# Patient Record
Sex: Male | Born: 1955 | Race: White | Hispanic: No | Marital: Married | Smoking: Former smoker
Health system: Southern US, Community
[De-identification: ages and names within clinical notes are randomized; demographics above are authoritative.]

## PROBLEM LIST (undated history)

## (undated) DIAGNOSIS — C801 Malignant (primary) neoplasm, unspecified: Secondary | ICD-10-CM

## (undated) DIAGNOSIS — G8929 Other chronic pain: Secondary | ICD-10-CM

---

## 2012-04-21 HISTORY — PX: THYROIDECTOMY: SHX17

## 2012-11-17 ENCOUNTER — Other Ambulatory Visit (HOSPITAL_COMMUNITY): Payer: Self-pay | Admitting: Endocrinology

## 2012-11-17 DIAGNOSIS — C73 Malignant neoplasm of thyroid gland: Secondary | ICD-10-CM

## 2012-11-24 ENCOUNTER — Encounter (HOSPITAL_COMMUNITY)
Admission: RE | Admit: 2012-11-24 | Discharge: 2012-11-24 | Disposition: A | Discharge status: Home / Self Care | Payer: BC Managed Care – PPO | Source: Ambulatory Visit | Attending: Endocrinology | Admitting: Endocrinology

## 2012-11-24 DIAGNOSIS — C73 Malignant neoplasm of thyroid gland: Secondary | ICD-10-CM | POA: Insufficient documentation

## 2012-11-24 MED ORDER — THYROTROPIN ALFA 1.1 MG IM SOLR
0.9000 mg | INTRAMUSCULAR | Status: DC
  Filled 2012-11-24: qty 0.9

## 2012-11-25 ENCOUNTER — Encounter (HOSPITAL_COMMUNITY)
Admission: RE | Admit: 2012-11-25 | Discharge: 2012-11-25 | Disposition: A | Discharge status: Home / Self Care | Payer: BC Managed Care – PPO | Source: Ambulatory Visit | Attending: Endocrinology | Admitting: Endocrinology

## 2012-11-25 MED ORDER — THYROTROPIN ALFA 1.1 MG IM SOLR
0.9000 mg | INTRAMUSCULAR | Status: AC
  Administered 2012-11-25: 0.9 mg via INTRAMUSCULAR
  Filled 2012-11-25: qty 0.9

## 2012-11-26 ENCOUNTER — Encounter (HOSPITAL_COMMUNITY)
Admission: RE | Admit: 2012-11-26 | Discharge: 2012-11-26 | Disposition: A | Discharge status: Home / Self Care | Payer: BC Managed Care – PPO | Source: Ambulatory Visit | Attending: Endocrinology | Admitting: Endocrinology

## 2012-11-26 MED ORDER — SODIUM IODIDE I 131 CAPSULE
71.8000 | Freq: Once | INTRAVENOUS | Status: AC | PRN
  Administered 2012-11-26: 71.8 via ORAL

## 2012-11-30 MED FILL — Thyrotropin Alfa For Inj 1.1 MG: INTRAMUSCULAR | Qty: 0.9 | Status: AC

## 2012-12-06 ENCOUNTER — Encounter (HOSPITAL_COMMUNITY)
Admission: RE | Admit: 2012-12-06 | Discharge: 2012-12-06 | Disposition: A | Discharge status: Home / Self Care | Payer: BC Managed Care – PPO | Source: Ambulatory Visit | Attending: Endocrinology | Admitting: Endocrinology

## 2012-12-06 DIAGNOSIS — C73 Malignant neoplasm of thyroid gland: Secondary | ICD-10-CM | POA: Insufficient documentation

## 2013-03-21 ENCOUNTER — Emergency Department (HOSPITAL_COMMUNITY): Payer: Worker's Compensation

## 2013-03-21 ENCOUNTER — Emergency Department (HOSPITAL_COMMUNITY)
Admission: EM | Admit: 2013-03-21 | Discharge: 2013-03-21 | Disposition: A | Discharge status: Home / Self Care | Payer: Worker's Compensation | Attending: Emergency Medicine | Admitting: Emergency Medicine

## 2013-03-21 ENCOUNTER — Encounter (HOSPITAL_COMMUNITY): Payer: Self-pay | Admitting: Emergency Medicine

## 2013-03-21 DIAGNOSIS — IMO0002 Reserved for concepts with insufficient information to code with codable children: Secondary | ICD-10-CM | POA: Insufficient documentation

## 2013-03-21 DIAGNOSIS — Z79899 Other long term (current) drug therapy: Secondary | ICD-10-CM | POA: Insufficient documentation

## 2013-03-21 DIAGNOSIS — M549 Dorsalgia, unspecified: Secondary | ICD-10-CM

## 2013-03-21 DIAGNOSIS — Y9389 Activity, other specified: Secondary | ICD-10-CM | POA: Insufficient documentation

## 2013-03-21 DIAGNOSIS — Z791 Long term (current) use of non-steroidal anti-inflammatories (NSAID): Secondary | ICD-10-CM | POA: Insufficient documentation

## 2013-03-21 DIAGNOSIS — M542 Cervicalgia: Secondary | ICD-10-CM

## 2013-03-21 DIAGNOSIS — Z87891 Personal history of nicotine dependence: Secondary | ICD-10-CM | POA: Insufficient documentation

## 2013-03-21 DIAGNOSIS — S0993XA Unspecified injury of face, initial encounter: Secondary | ICD-10-CM | POA: Insufficient documentation

## 2013-03-21 DIAGNOSIS — Z85850 Personal history of malignant neoplasm of thyroid: Secondary | ICD-10-CM | POA: Insufficient documentation

## 2013-03-21 DIAGNOSIS — Y92410 Unspecified street and highway as the place of occurrence of the external cause: Secondary | ICD-10-CM | POA: Insufficient documentation

## 2013-03-21 HISTORY — DX: Malignant (primary) neoplasm, unspecified: C80.1

## 2013-03-21 MED ORDER — MELOXICAM 7.5 MG PO TABS: 15.0000 mg | ORAL_TABLET | Freq: Every day | ORAL | Status: DC

## 2013-03-21 MED ORDER — TRAMADOL HCL 50 MG PO TABS: 50.0000 mg | ORAL_TABLET | Freq: Four times a day (QID) | ORAL | Status: DC | PRN

## 2013-03-21 MED ORDER — OXYCODONE-ACETAMINOPHEN 5-325 MG PO TABS
2.0000 | ORAL_TABLET | Freq: Once | ORAL | Status: AC
  Administered 2013-03-21: 2 via ORAL
  Filled 2013-03-21: qty 2

## 2013-03-21 MED ORDER — METHOCARBAMOL 500 MG PO TABS: 500.0000 mg | ORAL_TABLET | Freq: Two times a day (BID) | ORAL | Status: DC
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
## 2013-03-23 NOTE — ED Provider Notes (Signed)
Medical screening examination/treatment/procedure(s) were performed by non-physician practitioner and as supervising physician I was immediately available for consultation/collaboration.  EKG Interpretation   None         Candyce Churn, MD 03/23/13 1340

## 2014-08-13 IMAGING — NM NM RAI THYROID CANCER W/ THYROGEN
1 series · 1 of 1 positions shown · non-contrast
Comparison: None.

CLINICAL DATA: Thyroid cancer status post thyroidectomy 09/24/2012.

RADIOACTIVE IODINE THERAPY FOR THYROID CANCER:

[Series 1: st static · 1 of 1 slices shown]
[im 1/1]
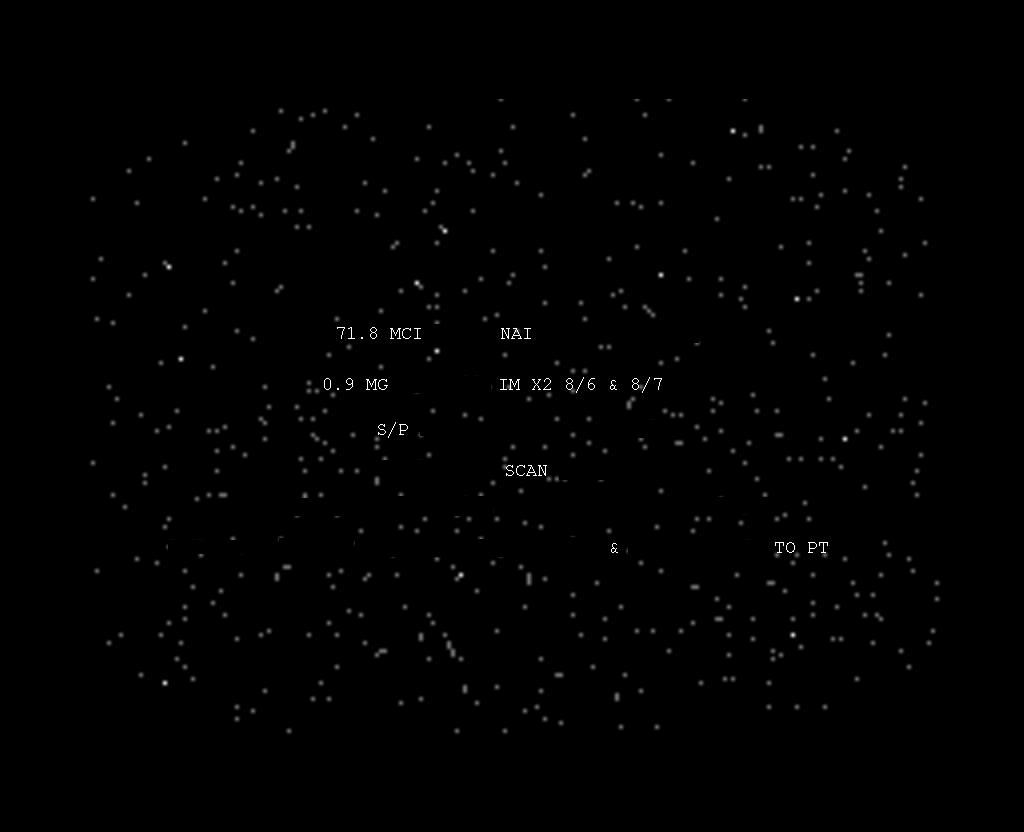

[1 of 1 positions shown; findings below may reference images not displayed]

Procedure: The patient was given intramuscular thyrogen 0.9 mg on
[DATE] and 11/25/2012.  Radioactive iodine was prescribed by Dr.
Menzel.  The risks and benefits of radioactive iodine therapy were
discussed with the patient in detail today by Dr. Ice.
Alternative therapies were also mentioned.  Radiation safety was
discussed with the patient, including how to protect the general
public from exposure.  There were no barriers to communication.
Written consent was obtained.  The patient then received a capsule
containing the radiopharmaceutical.

The patient will follow-up with the referring physician.

Radiopharmaceutical:  71.8 mCi V-KFK sodium iodide.
IMPRESSION: Per oral administration of V-KFK sodium iodide for the treatment of
thyroid cancer.
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
## 2015-08-09 DIAGNOSIS — C73 Malignant neoplasm of thyroid gland: Secondary | ICD-10-CM | POA: Insufficient documentation

## 2015-08-09 DIAGNOSIS — E890 Postprocedural hypothyroidism: Secondary | ICD-10-CM | POA: Insufficient documentation

## 2016-02-07 DIAGNOSIS — E785 Hyperlipidemia, unspecified: Secondary | ICD-10-CM | POA: Insufficient documentation

## 2016-08-07 DIAGNOSIS — J0101 Acute recurrent maxillary sinusitis: Secondary | ICD-10-CM | POA: Insufficient documentation

## 2017-02-25 DIAGNOSIS — D171 Benign lipomatous neoplasm of skin and subcutaneous tissue of trunk: Secondary | ICD-10-CM | POA: Insufficient documentation

## 2017-03-05 DIAGNOSIS — Z09 Encounter for follow-up examination after completed treatment for conditions other than malignant neoplasm: Secondary | ICD-10-CM | POA: Insufficient documentation

## 2017-04-23 DIAGNOSIS — Z9109 Other allergy status, other than to drugs and biological substances: Secondary | ICD-10-CM | POA: Insufficient documentation

## 2017-04-23 DIAGNOSIS — H7403 Tympanosclerosis, bilateral: Secondary | ICD-10-CM | POA: Insufficient documentation

## 2017-04-23 DIAGNOSIS — H93291 Other abnormal auditory perceptions, right ear: Secondary | ICD-10-CM | POA: Insufficient documentation

## 2021-05-27 DIAGNOSIS — L02612 Cutaneous abscess of left foot: Secondary | ICD-10-CM | POA: Diagnosis not present

## 2021-05-27 DIAGNOSIS — S161XXA Strain of muscle, fascia and tendon at neck level, initial encounter: Secondary | ICD-10-CM | POA: Diagnosis not present

## 2021-08-27 DIAGNOSIS — J329 Chronic sinusitis, unspecified: Secondary | ICD-10-CM | POA: Diagnosis not present

## 2021-08-27 DIAGNOSIS — J40 Bronchitis, not specified as acute or chronic: Secondary | ICD-10-CM | POA: Diagnosis not present

## 2021-08-27 DIAGNOSIS — Z6828 Body mass index (BMI) 28.0-28.9, adult: Secondary | ICD-10-CM | POA: Diagnosis not present

## 2021-09-30 DIAGNOSIS — Z6828 Body mass index (BMI) 28.0-28.9, adult: Secondary | ICD-10-CM | POA: Diagnosis not present

## 2021-09-30 DIAGNOSIS — M25561 Pain in right knee: Secondary | ICD-10-CM | POA: Diagnosis not present

## 2021-09-30 DIAGNOSIS — M533 Sacrococcygeal disorders, not elsewhere classified: Secondary | ICD-10-CM | POA: Diagnosis not present

## 2021-10-29 DIAGNOSIS — S91119A Laceration without foreign body of unspecified toe without damage to nail, initial encounter: Secondary | ICD-10-CM | POA: Diagnosis not present

## 2021-10-29 DIAGNOSIS — L84 Corns and callosities: Secondary | ICD-10-CM | POA: Diagnosis not present

## 2021-10-29 DIAGNOSIS — Z6827 Body mass index (BMI) 27.0-27.9, adult: Secondary | ICD-10-CM | POA: Diagnosis not present

## 2021-11-04 ENCOUNTER — Ambulatory Visit: Payer: Medicare PPO | Admitting: Podiatry

## 2021-11-04 ENCOUNTER — Encounter: Payer: Self-pay | Admitting: Podiatry

## 2021-11-04 DIAGNOSIS — L97521 Non-pressure chronic ulcer of other part of left foot limited to breakdown of skin: Secondary | ICD-10-CM | POA: Diagnosis not present

## 2021-11-04 NOTE — Progress Notes (Signed)
  Subjective:  Patient ID: Stephen Hose., male    DOB: [birth year],   MRN: [id number]  Chief Complaint  Patient presents with   Callouses    66 y.o. male presents for concern of callus on left great toe. Relates the callus has been there for 17+ years but recently in the last few months it blistered up and had blood come out. It healed and then did the same thing. Here for evaluation. Has been keeping liquid bandaid on it . He is not diabetic. Denies any other pedal complaints. Denies n/v/f/c.   Past Medical History:  Diagnosis Date   Cancer [facility])    Thyroid    Objective:  Physical Exam: Vascular: DP/PT pulses 2/4 bilateral. CFT <3 seconds. Normal hair growth on digits. No edema.  Skin. No lacerations or abrasions bilateral feet. Left hallux medially ulcer upon debridement of hyperkeratotic tissue. Granular base measures about 0.1 cm x 0.2 cmx 0.1 cm. No erythema edema or purulence noted.  Musculoskeletal: MMT 5/5 bilateral lower extremities in DF, PF, Inversion and Eversion. Deceased ROM in DF of ankle joint.  Neurological: Sensation intact to light touch.   Assessment:   1. Skin ulcer of great toe, limited to breakdown of skin, left [facility])      Plan:  Patient was evaluated and treated and all questions answered. Ulcer left hallux limited to breakdown of skin  -Debridement as below. -Dressed with neosporin, DSD. -Off-loading with toe cap -No abx indicated.  -Discussed glucose control and proper protein-rich diet.  -Discussed if any worsening redness, pain, fever or chills to call or may need to report to the emergency room. Patient expressed understanding.   Procedure: Excisional Debridement of Wound Rationale: Removal of non-viable soft tissue from the wound to promote healing.  Anesthesia: none Pre-Debridement Wound Measurements: Overlying callus  Post-Debridement Wound Measurements: 0.1 cm x 0.2 cm x 0.1 cm  Type of Debridement: Sharp Excisional Tissue Removed:  Non-viable soft tissue Depth of Debridement: subcutaneous tissue. Technique: Sharp excisional debridement to bleeding, viable wound base.  Dressing: Dry, sterile, compression dressing. Disposition: Patient tolerated procedure well. Patient to return in 3 week for follow-up.  Return in about 2 weeks (around 11/18/2021) for wound check.   Lorenda Peck, DPM

## 2021-11-25 ENCOUNTER — Ambulatory Visit: Payer: Medicare PPO | Admitting: Podiatry

## 2022-03-24 DIAGNOSIS — D3132 Benign neoplasm of left choroid: Secondary | ICD-10-CM | POA: Diagnosis not present

## 2022-03-24 DIAGNOSIS — H18413 Arcus senilis, bilateral: Secondary | ICD-10-CM | POA: Diagnosis not present

## 2022-03-24 DIAGNOSIS — H25813 Combined forms of age-related cataract, bilateral: Secondary | ICD-10-CM | POA: Diagnosis not present

## 2022-03-24 DIAGNOSIS — H532 Diplopia: Secondary | ICD-10-CM | POA: Diagnosis not present

## 2022-03-24 DIAGNOSIS — H35373 Puckering of macula, bilateral: Secondary | ICD-10-CM | POA: Diagnosis not present

## 2022-03-24 DIAGNOSIS — H43392 Other vitreous opacities, left eye: Secondary | ICD-10-CM | POA: Diagnosis not present

## 2022-09-08 DIAGNOSIS — Z79899 Other long term (current) drug therapy: Secondary | ICD-10-CM | POA: Diagnosis not present

## 2022-09-08 DIAGNOSIS — Z0000 Encounter for general adult medical examination without abnormal findings: Secondary | ICD-10-CM | POA: Diagnosis not present

## 2022-09-08 DIAGNOSIS — E039 Hypothyroidism, unspecified: Secondary | ICD-10-CM | POA: Diagnosis not present

## 2022-09-08 DIAGNOSIS — Z1331 Encounter for screening for depression: Secondary | ICD-10-CM | POA: Diagnosis not present

## 2022-09-08 DIAGNOSIS — Z1339 Encounter for screening examination for other mental health and behavioral disorders: Secondary | ICD-10-CM | POA: Diagnosis not present

## 2022-09-08 DIAGNOSIS — Z6828 Body mass index (BMI) 28.0-28.9, adult: Secondary | ICD-10-CM | POA: Diagnosis not present

## 2022-09-08 DIAGNOSIS — Z125 Encounter for screening for malignant neoplasm of prostate: Secondary | ICD-10-CM | POA: Diagnosis not present

## 2022-09-08 DIAGNOSIS — G629 Polyneuropathy, unspecified: Secondary | ICD-10-CM | POA: Diagnosis not present

## 2022-09-08 DIAGNOSIS — M2550 Pain in unspecified joint: Secondary | ICD-10-CM | POA: Diagnosis not present

## 2023-06-18 DIAGNOSIS — Z6829 Body mass index (BMI) 29.0-29.9, adult: Secondary | ICD-10-CM | POA: Diagnosis not present

## 2023-06-18 DIAGNOSIS — E039 Hypothyroidism, unspecified: Secondary | ICD-10-CM | POA: Diagnosis not present

## 2023-06-18 DIAGNOSIS — E7800 Pure hypercholesterolemia, unspecified: Secondary | ICD-10-CM | POA: Diagnosis not present

## 2023-06-18 DIAGNOSIS — G629 Polyneuropathy, unspecified: Secondary | ICD-10-CM | POA: Diagnosis not present

## 2023-06-18 DIAGNOSIS — K589 Irritable bowel syndrome without diarrhea: Secondary | ICD-10-CM | POA: Diagnosis not present

## 2023-08-07 DIAGNOSIS — R6883 Chills (without fever): Secondary | ICD-10-CM | POA: Diagnosis not present

## 2023-08-07 DIAGNOSIS — R1084 Generalized abdominal pain: Secondary | ICD-10-CM | POA: Diagnosis not present

## 2023-08-07 DIAGNOSIS — K529 Noninfective gastroenteritis and colitis, unspecified: Secondary | ICD-10-CM | POA: Diagnosis not present

## 2023-08-11 DIAGNOSIS — K5792 Diverticulitis of intestine, part unspecified, without perforation or abscess without bleeding: Secondary | ICD-10-CM | POA: Diagnosis not present

## 2023-11-16 ENCOUNTER — Ambulatory Visit (INDEPENDENT_AMBULATORY_CARE_PROVIDER_SITE_OTHER)
Admit: 2023-11-16 | Discharge: 2023-11-16 | Disposition: A | Discharge status: Home / Self Care | Attending: Internal Medicine | Admitting: Internal Medicine

## 2023-11-16 ENCOUNTER — Encounter (HOSPITAL_BASED_OUTPATIENT_CLINIC_OR_DEPARTMENT_OTHER): Payer: Self-pay

## 2023-11-16 ENCOUNTER — Other Ambulatory Visit (HOSPITAL_BASED_OUTPATIENT_CLINIC_OR_DEPARTMENT_OTHER): Payer: Self-pay

## 2023-11-16 ENCOUNTER — Ambulatory Visit (HOSPITAL_BASED_OUTPATIENT_CLINIC_OR_DEPARTMENT_OTHER)
Admission: EM | Admit: 2023-11-16 | Discharge: 2023-11-16 | Disposition: A | Discharge status: Home / Self Care | Attending: Internal Medicine | Admitting: Internal Medicine

## 2023-11-16 DIAGNOSIS — J208 Acute bronchitis due to other specified organisms: Secondary | ICD-10-CM | POA: Diagnosis not present

## 2023-11-16 DIAGNOSIS — B9689 Other specified bacterial agents as the cause of diseases classified elsewhere: Secondary | ICD-10-CM

## 2023-11-16 DIAGNOSIS — R059 Cough, unspecified: Secondary | ICD-10-CM | POA: Diagnosis not present

## 2023-11-16 DIAGNOSIS — R0602 Shortness of breath: Secondary | ICD-10-CM

## 2023-11-16 DIAGNOSIS — R918 Other nonspecific abnormal finding of lung field: Secondary | ICD-10-CM | POA: Diagnosis not present

## 2023-11-16 DIAGNOSIS — J984 Other disorders of lung: Secondary | ICD-10-CM | POA: Diagnosis not present

## 2023-11-16 HISTORY — DX: Other chronic pain: G89.29

## 2023-11-16 MED ORDER — AZITHROMYCIN 250 MG PO TABS
ORAL_TABLET | ORAL | 0 refills | Status: AC
  Filled 2023-11-16: qty 6, 5d supply, fill #0

## 2023-11-16 MED ORDER — AEROCHAMBER PLUS FLO-VU MEDIUM MISC
1.0000 | Freq: Once | Status: AC
  Administered 2023-11-16: 1

## 2023-11-16 MED ORDER — PROMETHAZINE-DM 6.25-15 MG/5ML PO SYRP
5.0000 mL | ORAL_SOLUTION | Freq: Every evening | ORAL | 0 refills | Status: AC | PRN
  Filled 2023-11-16: qty 118, 23d supply, fill #0

## 2023-11-16 MED ORDER — ALBUTEROL SULFATE (2.5 MG/3ML) 0.083% IN NEBU
2.5000 mg | INHALATION_SOLUTION | Freq: Once | RESPIRATORY_TRACT | Status: AC
  Administered 2023-11-16: 2.5 mg via RESPIRATORY_TRACT

## 2023-11-16 MED ORDER — ALBUTEROL SULFATE HFA 108 (90 BASE) MCG/ACT IN AERS
2.0000 | INHALATION_SPRAY | Freq: Once | RESPIRATORY_TRACT | Status: AC
  Administered 2023-11-16: 2 via RESPIRATORY_TRACT

## 2023-11-16 MED ORDER — GUAIFENESIN ER 600 MG PO TB12
600.0000 mg | ORAL_TABLET | Freq: Two times a day (BID) | ORAL | 0 refills | Status: AC
  Filled 2023-11-16: qty 30, 15d supply, fill #0

## 2023-11-16 NOTE — Discharge Instructions (Signed)
 You have bronchitis which is inflammation of the upper airways in your lungs due to a virus, however I would like to cover for an atypical bacterial infection with an antibiotic called azithromycin . Take 2 pills of azithromycin  today, then take 1 pill of azithromycin  antibiotic once daily for the next 4 days until finished.  Use albuterol  every 4-6 hours as needed for cough, shortness of breath, and wheezing.   Use guaifenesin  (plain mucinex ) to break up congestion in nose/chest so that you are able to excrete easier. Drink plenty of fluids to stay well hydrated while taking mucinex  so that it works well in the body.   Take Promethazine  DM cough syrup at bedtime as needed.  This will make you sleepy, so do not drive or drink alcohol when taking this medication.  If you develop any new or worsening symptoms or if your symptoms do not start to improve, please return here or follow-up with your primary care provider. If your symptoms are severe, please go to the emergency room.

## 2023-11-16 NOTE — ED Triage Notes (Signed)
 Pt c/o sore throat, chills, post nasal drainage, wheezing, chest tightness, loss of appetite, and HA. Pt denies fever and facial pain. He is concerned he has pneumonia. He has not taken anything for his symptoms.

## 2023-11-16 NOTE — ED Provider Notes (Signed)
 [facility]    CSN: [id number] Arrival date & time: 11/16/23  9083      History   Chief Complaint Chief Complaint  Patient presents with   Sore Throat    HPI Stephen Stroot. is a 68 y.o. male.   Stephen Daniels. is a 68 y.o. male presenting for chief complaint of Sore Throat, chest congestion, nasal congestion, cough, shortness of breath associated with coughing, chills, and generalized fatigue that started 6 days ago.  Cough is somewhat productive with yellow/clear phlegm.  Denies recent sick contacts with similar symptoms.  Reports intermittent shortness of breath associated with coughing.  He has heard wheezing to the chest.  States this is similar to how he felt when he had pneumonia years ago.  He has never been a smoker, however he is exposed to secondhand smoke (his wife is a smoker).  Denies history of asthma/COPD.  Denies nausea, vomiting, diarrhea, abdominal pain, rash, and recent antibiotic or steroid use.  He is taking over-the-counter medications with minimal relief.   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 Authorizing Provider  azithromycin  (ZITHROMAX ) 250 MG tablet Take 2 tablets (500 mg total) by mouth daily for 1 day, THEN 1 tablet (250 mg total) daily for 4 days. 11/16/23 11/21/23 Yes  Enedelia Dorna HERO, FNP  guaiFENesin  (MUCINEX ) 600 MG 12 hr tablet Take 1 tablet (600 mg total) by mouth 2 (two) times daily. 11/16/23  Yes Enedelia Dorna HERO, FNP  promethazine -dextromethorphan (PROMETHAZINE -DM) 6.25-15 MG/5ML syrup Take 5 mLs by mouth at bedtime as needed for cough. 11/16/23  Yes Enedelia Dorna HERO, FNP  dicyclomine (BENTYL) 10 MG capsule Take by mouth. 06/02/21   [provider]  levothyroxine (SYNTHROID) 125 MCG tablet Take by mouth. 10/05/17   [provider]  oxycodone  (OXY-IR) 5 MG capsule Take 5 mg by mouth every 6 (six) hours as needed. 10/10/21   [provider]    Family History History reviewed. No pertinent family history.  Social History Social History   Tobacco Use   Smoking status: Former   Smokeless tobacco: Never  Vaping Use   Vaping status: Never Used  Substance Use Topics   Alcohol use: No   Drug use: No     Allergies   Other and Prednisone   Review of Systems Review of Systems Per HPI  Physical Exam Triage Vital Signs ED Triage Vitals  Encounter Vitals Group     BP 11/16/23 0938 (!) 155/80     Girls Systolic BP Percentile --      Girls Diastolic BP Percentile --      Boys Systolic BP Percentile --      Boys Diastolic BP Percentile --      Pulse Rate 11/16/23 0938 69  Resp 11/16/23 0938 20     Temp 11/16/23 0938 97.8 F (36.6 C)     Temp Source 11/16/23 0938 Oral     SpO2 11/16/23 0938 95 %     Weight --      Height --      Head Circumference --      Peak Flow --      Pain Score 11/16/23 0931 8     Pain Loc --      Pain Education --      Exclude from Growth Chart --    No data found.  Updated Vital Signs BP (!) 155/80 (BP Location: Right Arm)   Pulse 69   Temp 97.8 F (36.6 C) (Oral)   Resp 20   SpO2 95%   Visual Acuity Right Eye Distance:   Left Eye Distance:   Bilateral Distance:    Right Eye Near:   Left Eye Near:    Bilateral Near:     Physical Exam Vitals and  nursing note reviewed.  Constitutional:      Appearance: He is not ill-appearing or toxic-appearing.  HENT:     Head: Normocephalic and atraumatic.     Right Ear: Hearing, tympanic membrane, ear canal and external ear normal.     Left Ear: Hearing, tympanic membrane, ear canal and external ear normal.     Nose: Congestion present.     Mouth/Throat:     Lips: Pink.     Mouth: Mucous membranes are moist. No injury or oral lesions.     Dentition: Normal dentition.     Tongue: No lesions.     Pharynx: Oropharynx is clear. Uvula midline. No pharyngeal swelling, oropharyngeal exudate, posterior oropharyngeal erythema, uvula swelling or postnasal drip.     Tonsils: No tonsillar exudate.     Comments: Mild erythema to posterior oropharynx with small amount of clear postnasal drainage visualized. Eyes:     General: Lids are normal. Vision grossly intact. Gaze aligned appropriately.     Extraocular Movements: Extraocular movements intact.     Conjunctiva/sclera: Conjunctivae normal.  Neck:     Trachea: Trachea and phonation normal.  Cardiovascular:     Rate and Rhythm: Normal rate and regular rhythm.     Heart sounds: Normal heart sounds, S1 normal and S2 normal.  Pulmonary:     Effort: Pulmonary effort is normal. No respiratory distress.     Breath sounds: Normal breath sounds and air entry. No wheezing, rhonchi or rales.     Comments: Coarse breath sounds throughout without focal finding.  Speaking in full sentences without difficulty. Harsh cough on exam with audible wheeze. Chest:     Chest wall: No tenderness.  Musculoskeletal:     Cervical back: Neck supple.  Lymphadenopathy:     Cervical: No cervical adenopathy.  Skin:    General: Skin is warm and dry.     Capillary Refill: Capillary refill takes less than 2 seconds.     Findings: No rash.  Neurological:     General: No focal deficit present.     Mental Status: He is alert and oriented to person, place, and time. Mental status  is at baseline.     Cranial Nerves: No dysarthria or facial asymmetry.  Psychiatric:        Mood and Affect: Mood normal.        Speech: Speech normal.        Behavior: Behavior normal.        Thought  Content: Thought content normal.        Judgment: Judgment normal.      UC Treatments / Results  Labs (all labs ordered are listed, but only abnormal results are displayed) Labs Reviewed - No data to display  EKG   Radiology DG Chest 2 View Result Date: 11/16/2023 CLINICAL DATA:  Cough and shortness of breath for 6 days EXAM: CHEST - 2 VIEW COMPARISON:  None Available. FINDINGS: No consolidation, pneumothorax or effusion. No edema. Minimal linear opacity left lung base likely scar or atelectasis. Normal cardiopericardial silhouette. Slight curvature of the spine. Degenerative changes. IMPRESSION: Underinflation. There is some linear opacity left lung base likely scar or atelectasis. Electronically Signed   By: Ranell Bring M.D.   On: 11/16/2023 10:40    Procedures Procedures (including critical care time)  Medications Ordered in UC Medications  albuterol  (VENTOLIN  HFA) 108 (90 Base) MCG/ACT inhaler 2 puff (has no administration in time range)  AeroChamber Plus Flo-Vu Medium MISC 1 each (has no administration in time range)  albuterol  (PROVENTIL ) (2.5 MG/3ML) 0.083% nebulizer solution 2.5 mg (2.5 mg Nebulization Given 11/16/23 1027)    Initial Impression / Assessment and Plan / UC Course  I have reviewed the triage vital signs and the nursing notes.  Pertinent labs & imaging results that were available during my care of the patient were reviewed by me and considered in my medical decision making (see chart for details).   1.  Acute bacterial bronchitis, shortness of breath Presentation is suspicious for acute bronchitis versus pneumonia.  No signs of CHF or volume overload. Chest x-ray shows underinflation with evidence of potential atelectasis versus lung scarring to the left  lower lobe.  Patient has a prednisone intolerance (tachycardia and hypertension). Would like to treat with azithromycin  antibiotic to cover for atypical bacteria and treat inflammation to the chest. Promethazine  DM cough syrup and guaifenesin  have also been ordered for symptomatic relief.  Albuterol  breathing treatment given in clinic with significant improvement in shortness of breath and wheezing prior to discharge.  You may use albuterol  inhaler 2 puffs every 4-6 hours as needed for shortness of breath and wheezing at home.  Counseled patient on potential for adverse effects with medications prescribed/recommended today, strict ER and return-to-clinic precautions discussed, patient verbalized understanding.    Final Clinical Impressions(s) / UC Diagnoses   Final diagnoses:  Shortness of breath  Acute bacterial bronchitis     Discharge Instructions      You have bronchitis which is inflammation of the upper airways in your lungs due to a virus, however I would like to cover for an atypical bacterial infection with an antibiotic called azithromycin . Take 2 pills of azithromycin  today, then take 1 pill of azithromycin  antibiotic once daily for the next 4 days until finished.  Use albuterol  every 4-6 hours as needed for cough, shortness of breath, and wheezing.   Use guaifenesin  (plain mucinex ) to break up congestion in nose/chest so that you are able to excrete easier. Drink plenty of fluids to stay well hydrated while taking mucinex  so that it works well in the body.   Take Promethazine  DM cough syrup at bedtime as needed.  This will make you sleepy, so do not drive or drink alcohol when taking this medication.  If you develop any new or worsening symptoms or if your symptoms do not start to improve, please return here or follow-up with your primary care provider. If your symptoms are severe, please go to the emergency room.  ED Prescriptions     Medication Sig Dispense  Auth. Provider   promethazine -dextromethorphan (PROMETHAZINE -DM) 6.25-15 MG/5ML syrup Take 5 mLs by mouth at bedtime as needed for cough. 118 mL Enedelia Going M, FNP   azithromycin  (ZITHROMAX ) 250 MG tablet Take 2 tablets (500 mg total) by mouth daily for 1 day, THEN 1 tablet (250 mg total) daily for 4 days. 6 tablet Enedelia Going HERO, FNP   guaiFENesin  (MUCINEX ) 600 MG 12 hr tablet Take 1 tablet (600 mg total) by mouth 2 (two) times daily. 30 tablet Enedelia Going HERO, FNP      PDMP not reviewed this encounter.   Enedelia Going HERO, [place] 11/16/23 1053

## 2024-03-21 DIAGNOSIS — J309 Allergic rhinitis, unspecified: Secondary | ICD-10-CM | POA: Diagnosis not present
